# Patient Record
Sex: Male | Born: 1985 | Race: Black or African American | Hispanic: No | Marital: Single | Smoking: Never smoker
Health system: Southern US, Community
[De-identification: ages and names within clinical notes are randomized; demographics above are authoritative.]

## PROBLEM LIST (undated history)

## (undated) HISTORY — PX: NO PAST SURGERIES: SHX2092

---

## 2017-12-28 ENCOUNTER — Ambulatory Visit (INDEPENDENT_AMBULATORY_CARE_PROVIDER_SITE_OTHER): Payer: Self-pay

## 2017-12-28 ENCOUNTER — Ambulatory Visit (INDEPENDENT_AMBULATORY_CARE_PROVIDER_SITE_OTHER): Payer: Self-pay | Admitting: Sports Medicine

## 2017-12-28 ENCOUNTER — Encounter: Payer: Self-pay | Admitting: Sports Medicine

## 2017-12-28 DIAGNOSIS — M79641 Pain in right hand: Secondary | ICD-10-CM

## 2017-12-28 DIAGNOSIS — M79642 Pain in left hand: Secondary | ICD-10-CM

## 2017-12-28 MED ORDER — PREDNISONE 50 MG PO TABS: ORAL_TABLET | ORAL | 0 refills | Status: DC

## 2017-12-28 MED ORDER — PREDNISONE 50 MG PO TABS: ORAL_TABLET | ORAL | 0 refills | Status: AC

## 2017-12-28 NOTE — Assessment & Plan Note (Signed)
Unclear etiology, may be mild synovitis in the MCPs and the PIPs. Significant morning stiffness. Adding a 5-day burst of prednisone, doing some rheumatoid testing.   X-rays of both hands. Return to see me in 2 weeks.

## 2017-12-28 NOTE — Progress Notes (Signed)
Subjective:    CC: Hand and wrist pain  HPI:  This is a pleasant 32 year old male, he has been doing a lot of boxing to stay in shape.  2 months ago he started to have pain that he localized over the dorsal radiocarpal joint as well as the small joints of the hand and fingers.  There is significant morning stiffness, no family history of rheumatoid arthritis.  Symptoms are moderate, worsening, he has been to [facility] urgent care couple of times with x-rays that were for the most part unrevealing.  Has never had rheumatoid testing.  He does not remember if medication prescribed provided much relief with urgent care.  I reviewed the past medical history, family history, social history, surgical history, and allergies today and no changes were needed.  Please see the problem list section below in epic for further details.  Past Medical History: History reviewed. No pertinent past medical history. Past Surgical History: Past Surgical History:  Procedure Laterality Date  . NO PAST SURGERIES     Social History: Social History   Socioeconomic History  . Marital status: Single    Spouse name: Not on file  . Number of children: Not on file  . Years of education: Not on file  . Highest education level: Not on file  Occupational History  . Not on file  Social Needs  . Financial resource strain: Not on file  . Food insecurity:    Worry: Not on file    Inability: Not on file  . Transportation needs:    Medical: Not on file    Non-medical: Not on file  Tobacco Use  . Smoking status: Never Smoker  . Smokeless tobacco: Never Used  Substance and Sexual Activity  . Alcohol use: Never    Frequency: Never  . Drug use: Never  . Sexual activity: Not on file  Lifestyle  . Physical activity:    Days per week: Not on file    Minutes per session: Not on file  . Stress: Not on file  Relationships  . Social connections:    Talks on phone: Not on file    Gets together: Not on file    Attends  religious service: Not on file    Active member of club or organization: Not on file    Attends meetings of clubs or organizations: Not on file    Relationship status: Not on file  Other Topics Concern  . Not on file  Social History Narrative  . Not on file   Family History: History reviewed. No pertinent family history. Allergies: No Known Allergies Medications: See med rec.  Review of Systems: No headache, visual changes, nausea, vomiting, diarrhea, constipation, dizziness, abdominal pain, skin rash, fevers, chills, night sweats, swollen lymph nodes, weight loss, chest pain, body aches, joint swelling, muscle aches, shortness of breath, mood changes, visual or auditory hallucinations.  Objective:    General: Well Developed, well nourished, and in no acute distress.  Neuro: Alert and oriented x3, extra-ocular muscles intact, sensation grossly intact.  HEENT: Normocephalic, atraumatic, pupils equal round reactive to light, neck supple, no masses, no lymphadenopathy, thyroid nonpalpable.  Skin: Warm and dry, no rashes noted.  Cardiac: Regular rate and rhythm, no murmurs rubs or gallops.  Respiratory: Clear to auscultation bilaterally. Not using accessory muscles, speaking in full sentences.  Abdominal: Soft, nontender, nondistended, positive bowel sounds, no masses, no organomegaly.  Bilateral wrists: Inspection normal with no visible erythema or swelling. ROM smooth and normal with  good flexion and extension and ulnar/radial deviation that is symmetrical with opposite wrist. Palpation is normal over metacarpals, navicular, lunate, and TFCC; tendons without tenderness/ swelling No snuffbox tenderness. No tenderness over Canal of Gu[place]. Strength 5/5 in all directions without pain. Negative tinel's and phalens signs. Negative Finkelstein sign. Negative Watson's test. Mild synovitis over the MCPs and the PIPs.  Impression and Recommendations:    The patient was counselled, risk  factors were discussed, anticipatory guidance given.  Bilateral hand pain Unclear etiology, may be mild synovitis in the MCPs and the PIPs. Significant morning stiffness. Adding a 5-day burst of prednisone, doing some rheumatoid testing.   X-rays of both hands. Return to see me in 2 weeks. ___________________________________________ Ihor Austin. Benjamin Stain, M.D., ABFM., CAQSM. Primary Care and Sports Medicine [facility]  Adjunct Instructor of Family Medicine  [employer]

## 2019-02-14 IMAGING — DX DG HAND COMPLETE 3+V*R*
3 series · 3 of 3 positions shown · non-contrast
Comparison: None.

CLINICAL DATA: Hand pain for 2 months, morning stiffness

EXAM:
RIGHT HAND - COMPLETE 3+ VIEW

[hand pa]
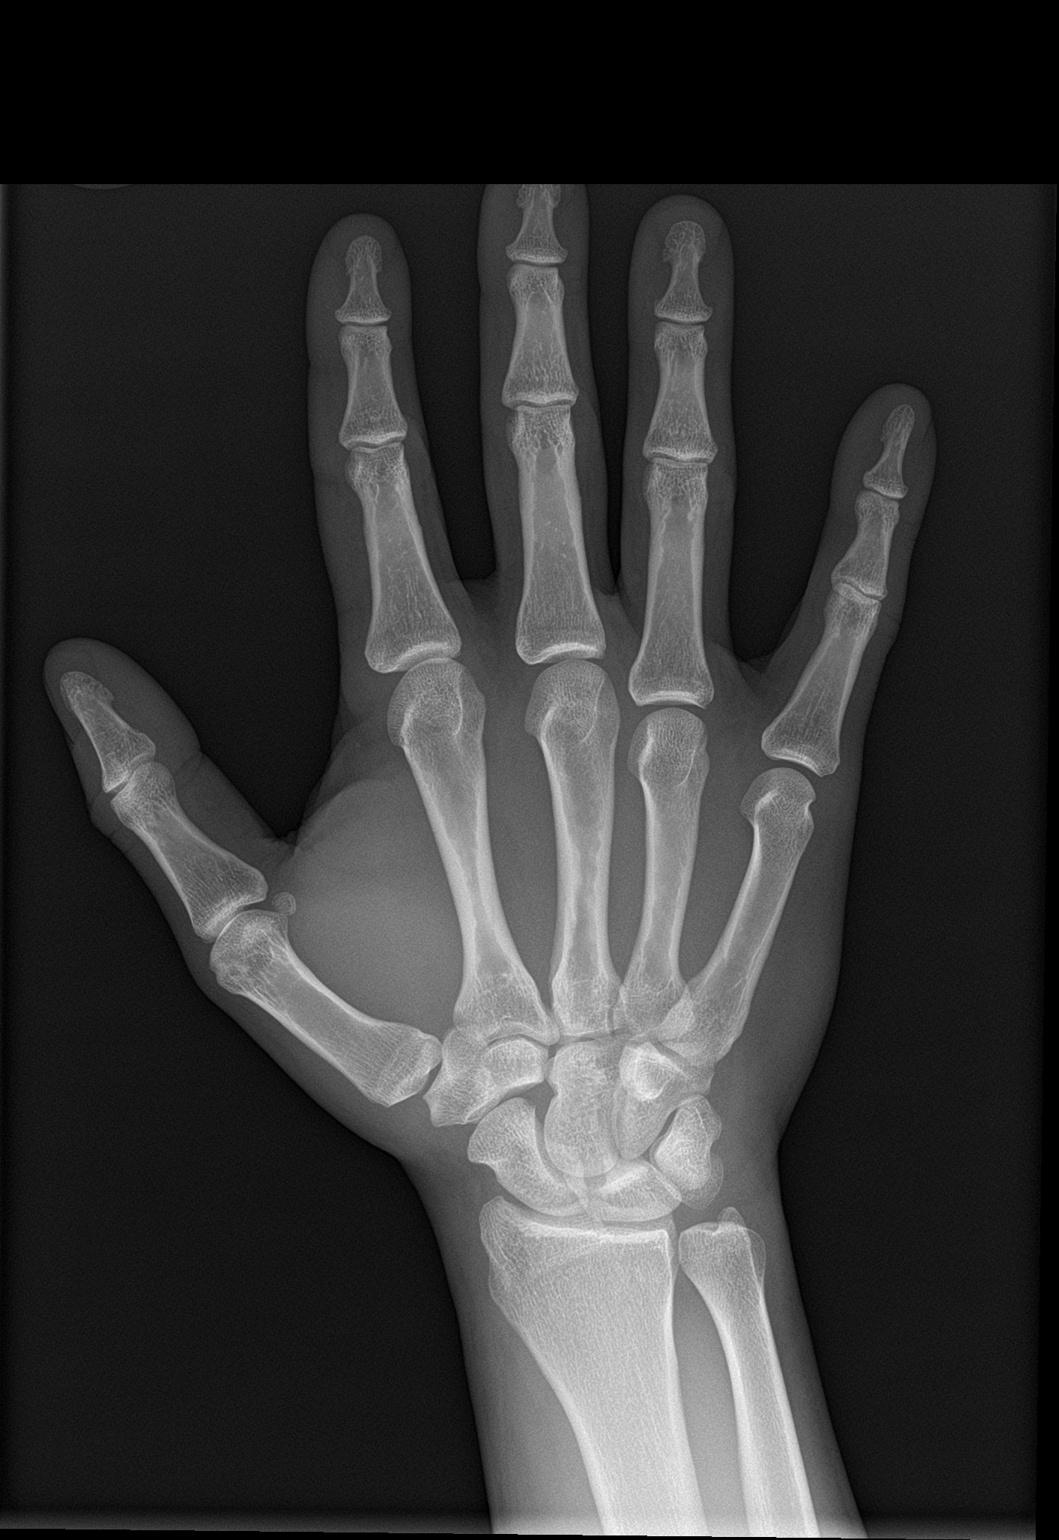

[hand obl]
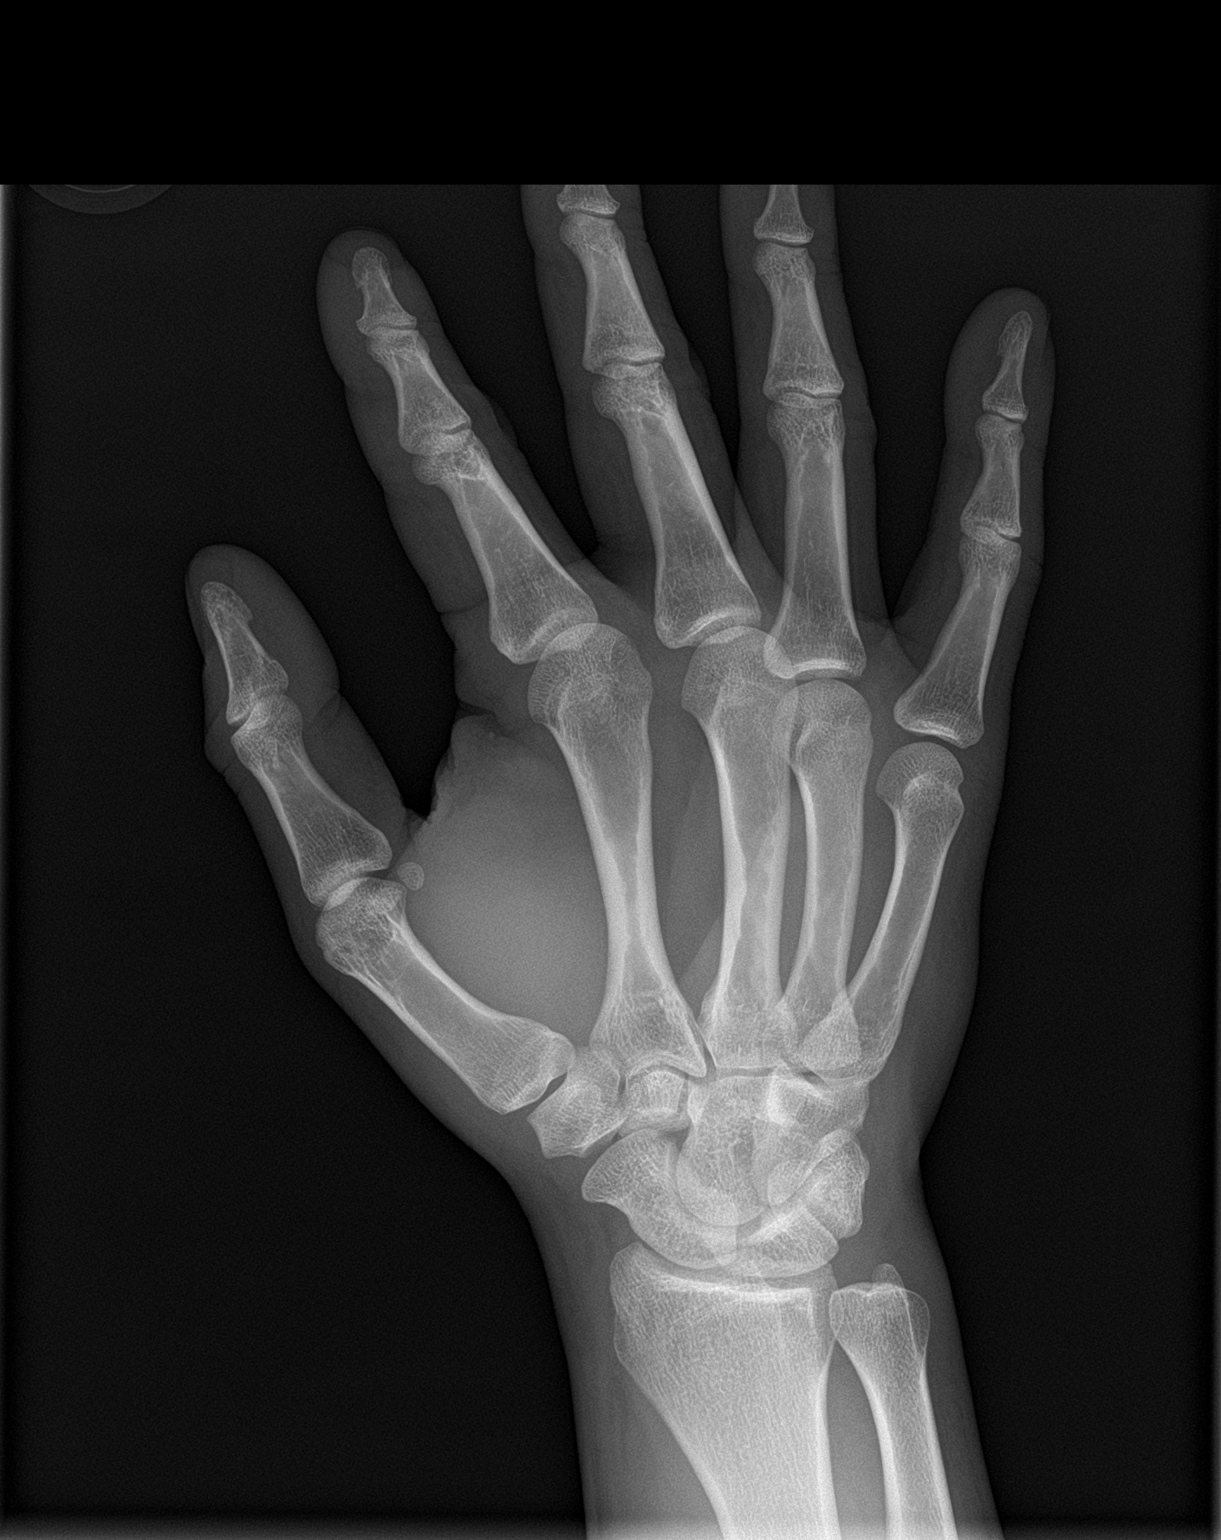

[hand lat]
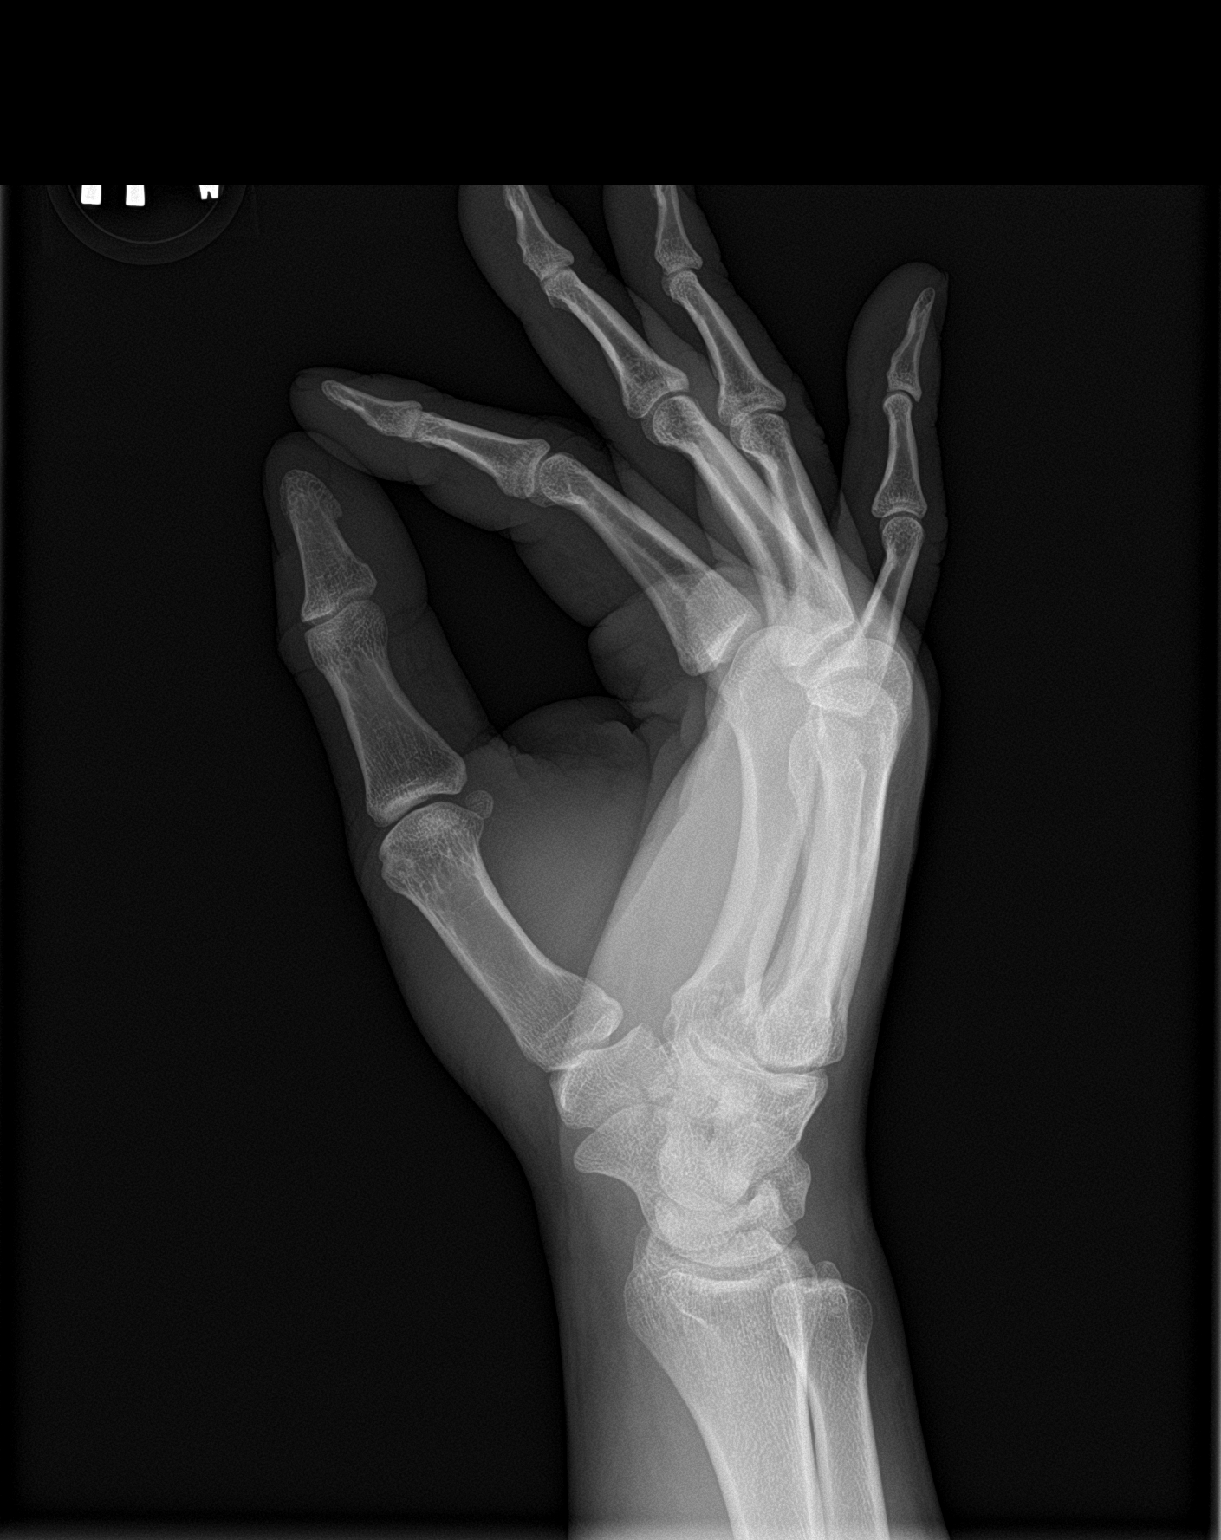

[3 of 3 positions shown; findings below may reference images not displayed]

FINDINGS: The right radiocarpal joint space appears normal and the ulnar
styloid is intact. The carpal bones are in normal position with
normal alignment. MCP, PIP, and DIP joints appear normal. No erosion
is seen.
IMPRESSION: Negative.

## 2019-02-14 IMAGING — DX DG HAND COMPLETE 3+V*L*
3 series · 3 of 3 positions shown · non-contrast
Comparison: None.

CLINICAL DATA: Bilateral hand pain.  Polyarthralgias.

EXAM:
LEFT HAND - COMPLETE 3+ VIEW

[hand pa]
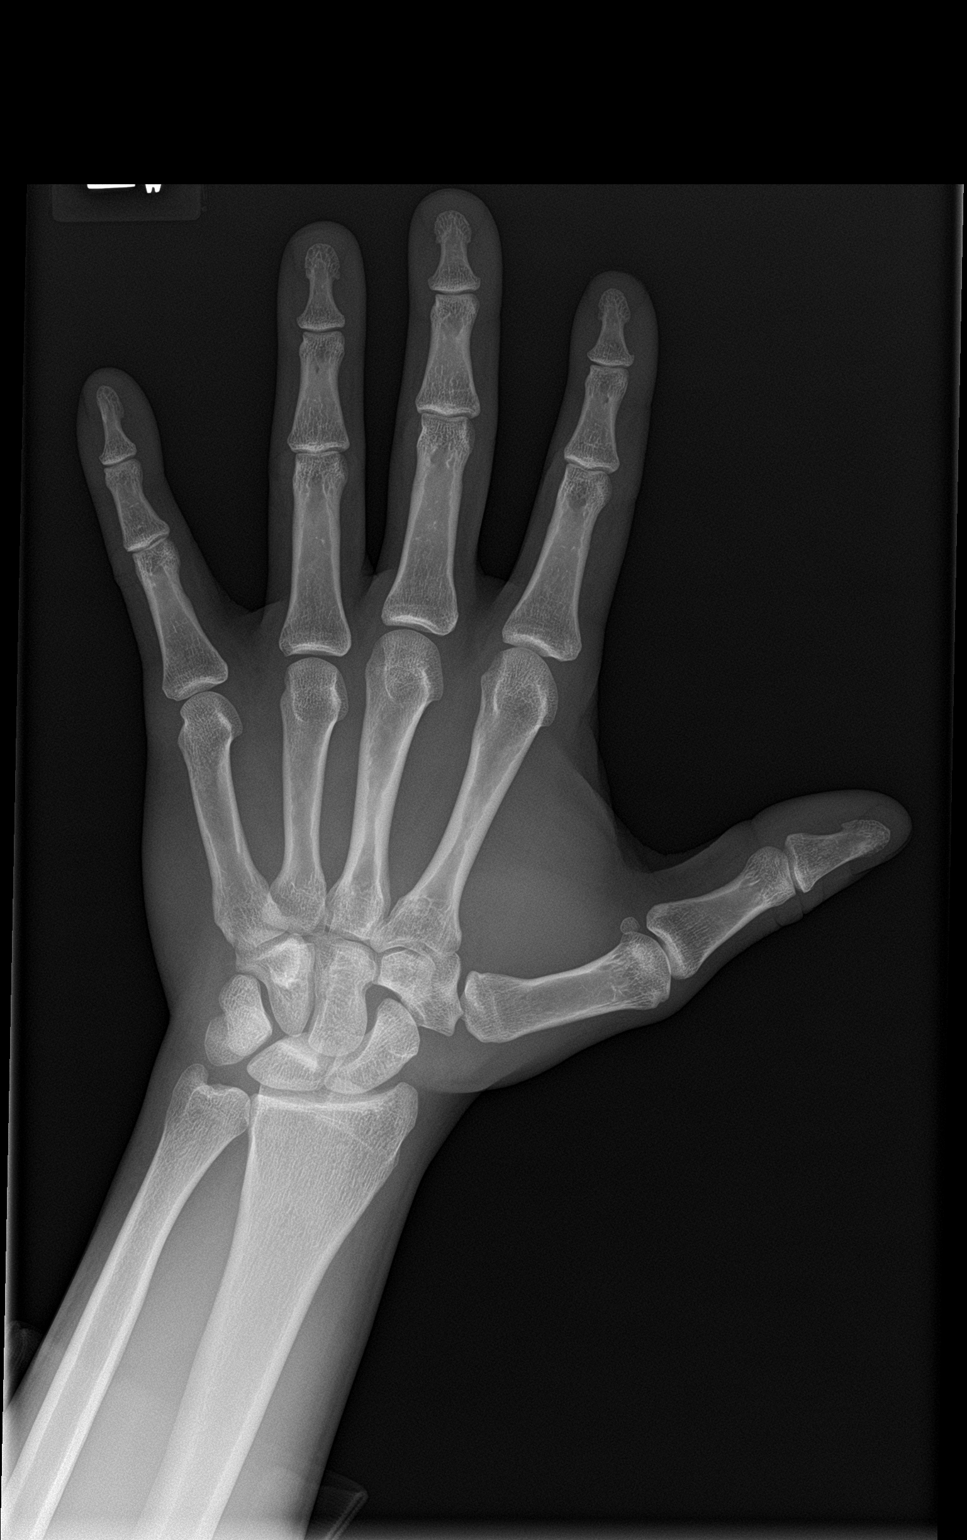

[hand obl]
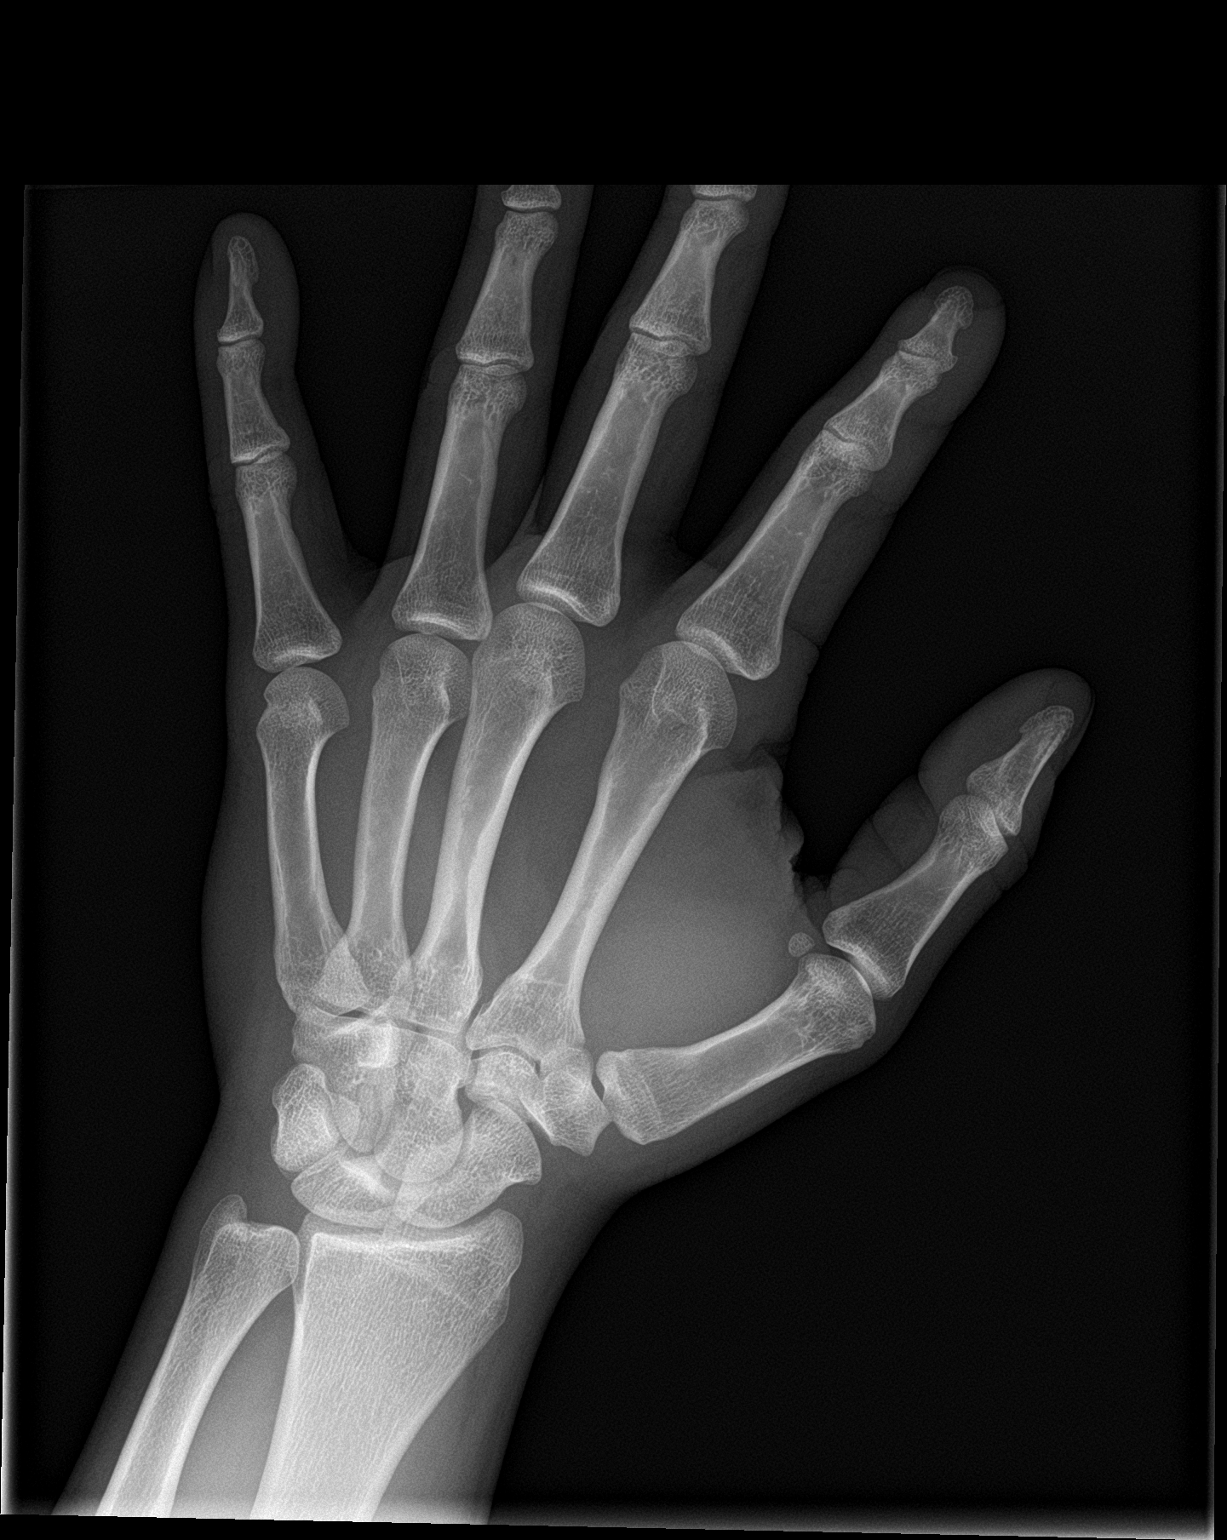

[hand lat]
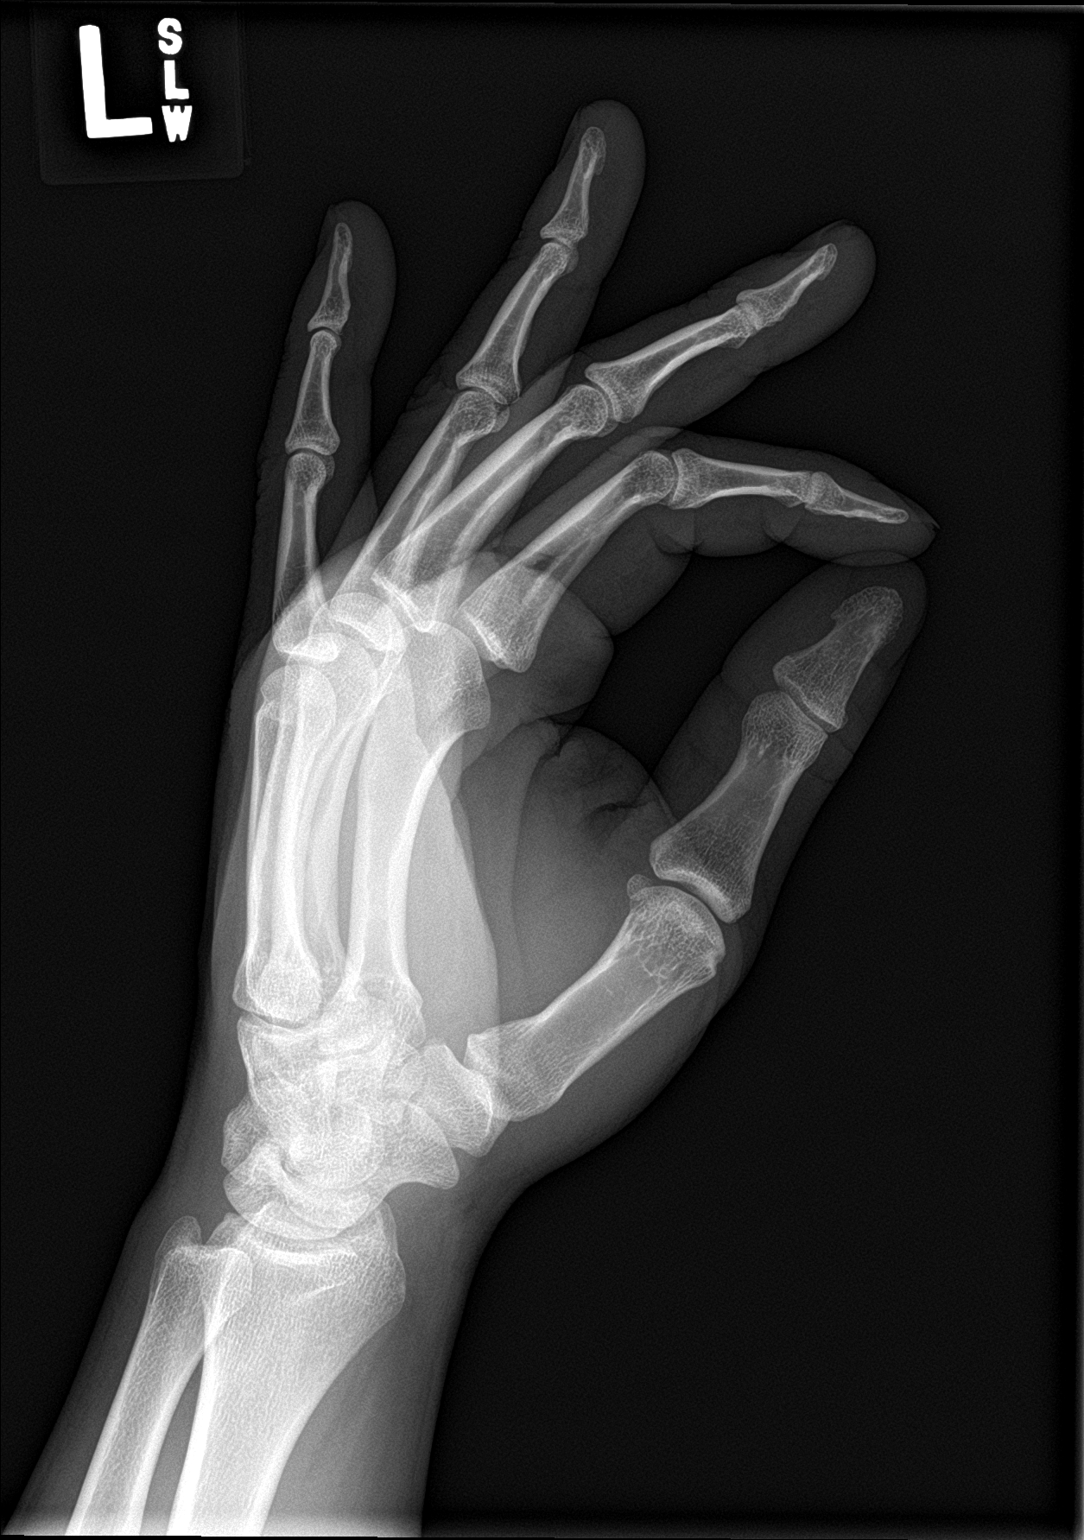

[3 of 3 positions shown; findings below may reference images not displayed]

FINDINGS: Bone mineralization is normal. Joints are within normal limits. No
erosions or sclerosis are present. The wrist is within normal
limits.
IMPRESSION: Negative left hand radiographs.
